# Patient Record
Sex: Female | Born: 1975 | Race: Black or African American | Hispanic: No | Marital: Single | Smoking: Never smoker
Health system: Southern US, Community
[De-identification: ages and names within clinical notes are randomized; demographics above are authoritative.]

## PROBLEM LIST (undated history)

## (undated) DIAGNOSIS — K219 Gastro-esophageal reflux disease without esophagitis: Secondary | ICD-10-CM

---

## 2005-03-13 ENCOUNTER — Emergency Department (HOSPITAL_COMMUNITY): Admission: EM | Admit: 2005-03-13 | Discharge: 2005-03-13 | Payer: Self-pay | Admitting: Emergency Medicine

## 2005-12-30 ENCOUNTER — Ambulatory Visit: Payer: Self-pay | Admitting: Family Medicine

## 2005-12-31 ENCOUNTER — Encounter (INDEPENDENT_AMBULATORY_CARE_PROVIDER_SITE_OTHER): Payer: Self-pay | Admitting: *Deleted

## 2006-01-06 ENCOUNTER — Ambulatory Visit: Payer: Self-pay | Admitting: Family Medicine

## 2006-01-13 ENCOUNTER — Inpatient Hospital Stay (HOSPITAL_COMMUNITY): Admission: AD | Admit: 2006-01-13 | Discharge: 2006-01-13 | Payer: Self-pay | Admitting: Gynecology

## 2006-01-25 ENCOUNTER — Ambulatory Visit (HOSPITAL_COMMUNITY): Admission: RE | Admit: 2006-01-25 | Discharge: 2006-01-25 | Payer: Self-pay | Admitting: Internal Medicine

## 2006-02-09 ENCOUNTER — Ambulatory Visit: Payer: Self-pay | Admitting: Family Medicine

## 2006-03-19 ENCOUNTER — Ambulatory Visit: Payer: Self-pay | Admitting: Family Medicine

## 2006-04-01 ENCOUNTER — Ambulatory Visit: Payer: Self-pay | Admitting: Family Medicine

## 2006-04-08 ENCOUNTER — Ambulatory Visit: Payer: Self-pay | Admitting: Family Medicine

## 2006-04-11 ENCOUNTER — Inpatient Hospital Stay (HOSPITAL_COMMUNITY): Admission: AD | Admit: 2006-04-11 | Discharge: 2006-04-11 | Payer: Self-pay | Admitting: *Deleted

## 2006-04-11 ENCOUNTER — Ambulatory Visit: Payer: Self-pay | Admitting: *Deleted

## 2006-04-21 ENCOUNTER — Ambulatory Visit: Payer: Self-pay

## 2006-05-06 ENCOUNTER — Ambulatory Visit: Payer: Self-pay | Admitting: Family Medicine

## 2006-05-20 ENCOUNTER — Ambulatory Visit: Payer: Self-pay | Admitting: Family Medicine

## 2006-06-11 ENCOUNTER — Ambulatory Visit: Payer: Self-pay | Admitting: Family Medicine

## 2006-06-18 ENCOUNTER — Ambulatory Visit: Payer: Self-pay | Admitting: Family Medicine

## 2006-06-19 ENCOUNTER — Inpatient Hospital Stay (HOSPITAL_COMMUNITY): Admission: AD | Admit: 2006-06-19 | Discharge: 2006-06-19 | Payer: Self-pay | Admitting: Obstetrics & Gynecology

## 2006-06-20 ENCOUNTER — Inpatient Hospital Stay (HOSPITAL_COMMUNITY): Admission: AD | Admit: 2006-06-20 | Discharge: 2006-06-22 | Payer: Self-pay | Admitting: Obstetrics & Gynecology

## 2006-06-20 ENCOUNTER — Ambulatory Visit: Payer: Self-pay | Admitting: Obstetrics & Gynecology

## 2006-06-24 ENCOUNTER — Ambulatory Visit: Admission: RE | Admit: 2006-06-24 | Discharge: 2006-06-24 | Payer: Self-pay | Admitting: Obstetrics & Gynecology

## 2006-08-04 ENCOUNTER — Ambulatory Visit: Payer: Self-pay | Admitting: Sports Medicine

## 2006-12-30 DIAGNOSIS — J45909 Unspecified asthma, uncomplicated: Secondary | ICD-10-CM | POA: Insufficient documentation

## 2006-12-31 ENCOUNTER — Encounter (INDEPENDENT_AMBULATORY_CARE_PROVIDER_SITE_OTHER): Payer: Self-pay | Admitting: *Deleted

## 2007-11-18 ENCOUNTER — Ambulatory Visit: Payer: Self-pay | Admitting: Family Medicine

## 2007-11-18 ENCOUNTER — Encounter: Payer: Self-pay | Admitting: Family Medicine

## 2007-11-18 DIAGNOSIS — N390 Urinary tract infection, site not specified: Secondary | ICD-10-CM | POA: Insufficient documentation

## 2007-11-18 LAB — CONVERTED CEMR LAB
Antibody Screen: NEGATIVE
Basophils Relative: 0 % (ref 0–1)
Eosinophils Absolute: 0.1 10*3/uL (ref 0.0–0.7)
Eosinophils Relative: 1 % (ref 0–5)
Hemoglobin: 11.6 g/dL — ABNORMAL LOW (ref 12.0–15.0)
Lymphs Abs: 1.7 10*3/uL (ref 0.7–4.0)
Monocytes Absolute: 0.3 10*3/uL (ref 0.1–1.0)
Neutro Abs: 5.4 10*3/uL (ref 1.7–7.7)
Neutrophils Relative %: 73 % (ref 43–77)
Platelets: 318 10*3/uL (ref 150–400)
RBC: 3.77 M/uL — ABNORMAL LOW (ref 3.87–5.11)
Rh Type: POSITIVE

## 2007-11-22 ENCOUNTER — Encounter (INDEPENDENT_AMBULATORY_CARE_PROVIDER_SITE_OTHER): Payer: Self-pay | Admitting: Family Medicine

## 2007-11-22 ENCOUNTER — Encounter: Payer: Self-pay | Admitting: Family Medicine

## 2007-11-22 ENCOUNTER — Ambulatory Visit: Payer: Self-pay | Admitting: Family Medicine

## 2007-11-22 LAB — CONVERTED CEMR LAB
Chlamydia, DNA Probe: NEGATIVE
GC Probe Amp, Genital: NEGATIVE

## 2007-11-28 ENCOUNTER — Ambulatory Visit (HOSPITAL_COMMUNITY): Admission: RE | Admit: 2007-11-28 | Discharge: 2007-11-28 | Payer: Self-pay | Admitting: Family Medicine

## 2007-11-28 ENCOUNTER — Telehealth: Payer: Self-pay | Admitting: *Deleted

## 2007-11-29 ENCOUNTER — Encounter: Payer: Self-pay | Admitting: Family Medicine

## 2007-12-22 ENCOUNTER — Ambulatory Visit: Payer: Self-pay | Admitting: Family Medicine

## 2007-12-22 ENCOUNTER — Encounter: Payer: Self-pay | Admitting: Family Medicine

## 2007-12-22 LAB — CONVERTED CEMR LAB
Bilirubin Urine: NEGATIVE
Urobilinogen, UA: 1
pH: 6.5

## 2008-01-02 ENCOUNTER — Encounter: Payer: Self-pay | Admitting: Family Medicine

## 2008-01-17 ENCOUNTER — Ambulatory Visit: Payer: Self-pay | Admitting: Family Medicine

## 2008-01-18 ENCOUNTER — Encounter: Payer: Self-pay | Admitting: Family Medicine

## 2008-01-23 ENCOUNTER — Ambulatory Visit: Payer: Self-pay | Admitting: Sports Medicine

## 2008-01-23 ENCOUNTER — Encounter: Payer: Self-pay | Admitting: Family Medicine

## 2008-02-21 ENCOUNTER — Ambulatory Visit: Payer: Self-pay | Admitting: Family Medicine

## 2008-02-21 ENCOUNTER — Encounter: Payer: Self-pay | Admitting: Family Medicine

## 2008-02-21 LAB — CONVERTED CEMR LAB
HCT: 33.1 % — ABNORMAL LOW (ref 36.0–46.0)
Hemoglobin: 11.2 g/dL — ABNORMAL LOW (ref 12.0–15.0)
MCV: 93.2 fL (ref 78.0–100.0)
Platelets: 244 10*3/uL (ref 150–400)
RDW: 14.3 % (ref 11.5–15.5)

## 2008-02-28 ENCOUNTER — Ambulatory Visit: Payer: Self-pay | Admitting: Family Medicine

## 2008-03-08 ENCOUNTER — Ambulatory Visit: Payer: Self-pay | Admitting: Family Medicine

## 2008-03-08 LAB — CONVERTED CEMR LAB: Protein, U semiquant: NEGATIVE

## 2008-03-22 ENCOUNTER — Ambulatory Visit: Payer: Self-pay | Admitting: Family Medicine

## 2008-03-22 LAB — CONVERTED CEMR LAB

## 2008-03-28 ENCOUNTER — Ambulatory Visit: Payer: Self-pay | Admitting: Family Medicine

## 2008-03-28 ENCOUNTER — Encounter: Payer: Self-pay | Admitting: Family Medicine

## 2008-03-28 LAB — CONVERTED CEMR LAB
GC Probe Amp, Genital: NEGATIVE
Glucose, Urine, Semiquant: NEGATIVE

## 2008-03-29 ENCOUNTER — Encounter: Payer: Self-pay | Admitting: Family Medicine

## 2008-04-05 ENCOUNTER — Ambulatory Visit: Payer: Self-pay | Admitting: Family Medicine

## 2008-04-05 LAB — CONVERTED CEMR LAB: Glucose, Urine, Semiquant: NEGATIVE

## 2008-04-11 ENCOUNTER — Inpatient Hospital Stay (HOSPITAL_COMMUNITY): Admission: AD | Admit: 2008-04-11 | Discharge: 2008-04-13 | Payer: Self-pay | Admitting: Family Medicine

## 2008-04-11 ENCOUNTER — Ambulatory Visit: Payer: Self-pay | Admitting: *Deleted

## 2008-05-28 ENCOUNTER — Encounter: Payer: Self-pay | Admitting: Family Medicine

## 2008-05-28 ENCOUNTER — Ambulatory Visit: Payer: Self-pay | Admitting: Family Medicine

## 2008-05-28 ENCOUNTER — Encounter (INDEPENDENT_AMBULATORY_CARE_PROVIDER_SITE_OTHER): Payer: Self-pay | Admitting: Family Medicine

## 2008-05-28 DIAGNOSIS — D179 Benign lipomatous neoplasm, unspecified: Secondary | ICD-10-CM | POA: Insufficient documentation

## 2008-05-28 LAB — CONVERTED CEMR LAB
Cholesterol: 167 mg/dL (ref 0–200)
HDL: 57 mg/dL (ref >39)
LDL Cholesterol: 101 mg/dL — ABNORMAL HIGH (ref 0–99)
Total CHOL/HDL Ratio: 2.9
VLDL: 9 mg/dL (ref 0–40)

## 2009-01-13 ENCOUNTER — Encounter: Payer: Self-pay | Admitting: Family Medicine

## 2009-12-12 ENCOUNTER — Emergency Department (HOSPITAL_COMMUNITY): Admission: EM | Admit: 2009-12-12 | Discharge: 2009-12-12 | Payer: Self-pay | Admitting: Emergency Medicine

## 2009-12-25 ENCOUNTER — Encounter: Payer: Self-pay | Admitting: Family Medicine

## 2009-12-25 ENCOUNTER — Ambulatory Visit: Payer: Self-pay | Admitting: Internal Medicine

## 2009-12-25 LAB — CONVERTED CEMR LAB
Albumin: 4.2 g/dL (ref 3.5–5.2)
BUN: 12 mg/dL (ref 6–23)
Basophils Absolute: 0 10*3/uL (ref 0.0–0.1)
Calcium: 9.1 mg/dL (ref 8.4–10.5)
Chloride: 102 meq/L (ref 96–112)
Cholesterol: 142 mg/dL (ref 0–200)
Creatinine, Ser: 0.7 mg/dL (ref 0.40–1.20)
Eosinophils Relative: 2 % (ref 0–5)
HDL: 53 mg/dL (ref >39)
LDL Cholesterol: 82 mg/dL (ref 0–99)
Lymphocytes Relative: 45 % (ref 12–46)
Lymphs Abs: 2.5 10*3/uL (ref 0.7–4.0)
Monocytes Absolute: 0.4 10*3/uL (ref 0.1–1.0)
TSH: 1.016 microintl units/mL (ref 0.350–4.500)
Total CHOL/HDL Ratio: 2.7
Total Protein: 7.3 g/dL (ref 6.0–8.3)
Triglycerides: 34 mg/dL (ref <150)
VLDL: 7 mg/dL (ref 0–40)
WBC: 5.5 10*3/uL (ref 4.0–10.5)

## 2010-01-22 ENCOUNTER — Ambulatory Visit: Payer: Self-pay | Admitting: Internal Medicine

## 2010-01-22 ENCOUNTER — Encounter: Payer: Self-pay | Admitting: Family Medicine

## 2010-01-22 ENCOUNTER — Ambulatory Visit (HOSPITAL_COMMUNITY): Admission: RE | Admit: 2010-01-22 | Discharge: 2010-01-22 | Payer: Self-pay | Admitting: Family Medicine

## 2010-01-22 LAB — CONVERTED CEMR LAB
Hgb A2 Quant: 2.9 % (ref 2.2–3.2)
Rhuematoid fact SerPl-aCnc: <20 intl units/mL (ref 0–20)

## 2010-02-24 ENCOUNTER — Ambulatory Visit: Payer: Self-pay | Admitting: Internal Medicine

## 2010-02-24 ENCOUNTER — Encounter: Payer: Self-pay | Admitting: Family Medicine

## 2010-02-28 ENCOUNTER — Ambulatory Visit: Payer: Self-pay | Admitting: Family Medicine

## 2010-04-14 ENCOUNTER — Encounter (INDEPENDENT_AMBULATORY_CARE_PROVIDER_SITE_OTHER): Payer: Self-pay | Admitting: Family Medicine

## 2010-04-14 ENCOUNTER — Ambulatory Visit: Payer: Self-pay | Admitting: Internal Medicine

## 2010-04-14 LAB — CONVERTED CEMR LAB: Microalb, Ur: 0.5 mg/dL (ref 0.00–1.89)

## 2010-12-02 NOTE — Assessment & Plan Note (Signed)
Summary: discuss blood work,tcb   Vital Signs:  Patient profile:   35 year old female Weight:      167.7 pounds Temp:     98 degrees F oral Pulse rate:   92 / minute Pulse rhythm:   regular BP sitting:   111 / 78  (left arm) Cuff size:   regular  Vitals Entered By: Loralee Pacas CMA (February 28, 2010 11:57 AM)   History of Present Illness: no charge visit.  pt just wanted to know what her past sickle cell screens showed.  last one was negative.     Complete Medication List: 1)  Ventolin Hfa 108 (90 Base) Mcg/act Aers (Albuterol sulfate) .... Take 1-2 puffs every 4 hours as needed for wheezing 2)  P-d Natal Plus 0.8 Mg Tabs (Prenatal multivit-min-fe-fa) .... Take 1 tablet by mouth daily  Other Orders: No Charge Patient Arrived (NCPA0) (NCPA0)

## 2011-07-30 LAB — CBC
Platelets: 256
RDW: 14.1
WBC: 10

## 2011-09-17 ENCOUNTER — Encounter: Payer: Self-pay | Admitting: Emergency Medicine

## 2011-09-17 ENCOUNTER — Emergency Department (INDEPENDENT_AMBULATORY_CARE_PROVIDER_SITE_OTHER)
Admission: EM | Admit: 2011-09-17 | Discharge: 2011-09-17 | Disposition: A | Discharge status: Home / Self Care | Payer: Self-pay | Source: Home / Self Care

## 2011-09-17 DIAGNOSIS — J309 Allergic rhinitis, unspecified: Secondary | ICD-10-CM

## 2011-09-17 DIAGNOSIS — J45909 Unspecified asthma, uncomplicated: Secondary | ICD-10-CM

## 2011-09-17 MED ORDER — FEXOFENADINE-PSEUDOEPHED ER 60-120 MG PO TB12: 1.0000 | ORAL_TABLET | Freq: Two times a day (BID) | ORAL | Status: AC

## 2011-09-17 MED ORDER — PREDNISONE 20 MG PO TABS: ORAL_TABLET | ORAL | Status: AC

## 2011-09-17 NOTE — ED Notes (Signed)
C/o severe cough.  Onset 3 weeks ago.

## 2011-09-18 NOTE — ED Provider Notes (Signed)
History     CSN: [id number] Arrival date & time: 09/17/2011  7:56 PM   First MD Initiated Contact with Patient 09/17/11 1944      Chief Complaint  Patient presents with  . Cough    (Consider location/radiation/quality/duration/timing/severity/associated sxs/prior treatment) Patient is a 35 y.o. female presenting with cough. The history is provided by the patient.  Cough This is a new problem. The current episode started more than 1 week ago. The problem occurs constantly. The problem has not changed since onset.The cough is non-productive. There has been no fever. Associated symptoms include rhinorrhea and wheezing. Pertinent negatives include no chest pain, no chills, no sweats, no weight loss, no ear pain, no headaches, no sore throat, no myalgias and no shortness of breath. She has tried cough syrup for the symptoms. The treatment provided mild relief. She is not a smoker. Her past medical history is significant for asthma. Her past medical history does not include pneumonia or COPD.    Past Medical History  Diagnosis Date  . Asthma     History reviewed. No pertinent past surgical history.  History reviewed. No pertinent family history.  History  Substance Use Topics  . Smoking status: Never Smoker   . Smokeless tobacco: Not on file  . Alcohol Use: No    OB History    Grav Para Term Preterm Abortions TAB SAB Ect Mult Living                  Review of Systems  Constitutional: Negative.  Negative for chills and weight loss.  HENT: Positive for congestion, rhinorrhea, sneezing, voice change and postnasal drip. Negative for ear pain, sore throat, trouble swallowing and neck pain.   Eyes: Negative for discharge.  Respiratory: Positive for cough and wheezing. Negative for chest tightness, shortness of breath and stridor.   Cardiovascular: Negative for chest pain.  Gastrointestinal: Negative for nausea, vomiting and diarrhea.  Musculoskeletal: Negative for myalgias and  arthralgias.  Skin: Negative for rash.  Neurological: Negative for headaches.    Allergies  Review of patient's allergies indicates no known allergies.  Home Medications   Current Outpatient Rx  Name Route Sig Dispense Refill  . VENTOLIN IN Inhalation Inhale into the lungs.      . TRIAMCINOLONE ACETONIDE 55 MCG/ACT NA INHA Nasal Place 2 sprays into the nose daily.      Marland Kitchen FEXOFENADINE-PSEUDOEPHEDRINE 60-120 MG PO TB12 Oral Take 1 tablet by mouth every 12 (twelve) hours. 30 tablet 0  . PREDNISONE 20 MG PO TABS  2 tabs po daily for 5 days 10 tablet no    BP 132/87  Pulse 76  Temp(Src) 98.6 F (37 C) (Oral)  Resp 18  SpO2 98%  LMP 09/16/2011  Physical Exam  Nursing note and vitals reviewed. Constitutional: She is oriented to person, place, and time. She appears well-developed and well-nourished. No distress.  HENT:  Right Ear: External ear normal.  Left Ear: External ear normal.  Mouth/Throat: No oropharyngeal exudate.       Significant erythema and swelling of nasal turbinates. Clear rhinorrhea and postnasal drip. Mild pharyngeal erythema  Hoarseness   Eyes: Pupils are equal, round, and reactive to light. Right eye exhibits no discharge. Left eye exhibits no discharge.  Neck: Normal range of motion. No JVD present. No thyromegaly present.  Cardiovascular: Normal rate, regular rhythm and normal heart sounds.   Pulmonary/Chest: No stridor. No respiratory distress. She has no wheezes. She has no rales.  Lymphadenopathy:  She has no cervical adenopathy.  Neurological: She is alert and oriented to person, place, and time.  Skin: No rash noted.    ED Course  Procedures (including critical care time)  Labs Reviewed - No data to display No results found.   1. Allergic rhinitis   2. Asthma       MDM  Treated for allergic rhinitis likely causing asthma exacerbation with prednisone, allegra D, continue inhaler and restart nasal steroid. Return if worsening or no  resolving symptoms.        Sharin Grave, MD 09/18/11 1312

## 2012-05-12 ENCOUNTER — Encounter (HOSPITAL_COMMUNITY): Payer: Self-pay | Admitting: Emergency Medicine

## 2012-05-12 ENCOUNTER — Emergency Department (INDEPENDENT_AMBULATORY_CARE_PROVIDER_SITE_OTHER)
Admission: EM | Admit: 2012-05-12 | Discharge: 2012-05-12 | Disposition: A | Discharge status: Home / Self Care | Payer: Self-pay | Source: Home / Self Care | Attending: Emergency Medicine | Admitting: Emergency Medicine

## 2012-05-12 ENCOUNTER — Emergency Department (INDEPENDENT_AMBULATORY_CARE_PROVIDER_SITE_OTHER): Payer: Self-pay

## 2012-05-12 DIAGNOSIS — S300XXA Contusion of lower back and pelvis, initial encounter: Secondary | ICD-10-CM

## 2012-05-12 MED ORDER — TRAMADOL HCL 50 MG PO TABS: 100.0000 mg | ORAL_TABLET | Freq: Three times a day (TID) | ORAL | Status: AC | PRN

## 2012-05-12 NOTE — ED Notes (Signed)
Pt here with localized tail bone pain after tripping down staircase at home Tuesday night.landed on back.pt has been taking tylenol for discomfort.pain with sitting or movement.

## 2012-05-12 NOTE — ED Provider Notes (Signed)
Chief Complaint  Patient presents with  . Tailbone Pain  . Fall    History of Present Illness:    The patient is a 36 year old female who slipped on some steps 3 days ago and fell, landing on her buttocks. Ever since then she's had pain over her coccyx area. It hurts her to assist, particularly on hard surface. She denies any swelling or bruising. There has been no blood in her stool or her urine. She denies any pain with defecation or with intercourse. She denies any radiation the pain down into her legs, numbness, tingling, or weakness.  Review of Systems:  Other than noted above, the patient denies any of the following symptoms: Systemic:  No fevers or chills. GI:  No abdominal pain. No nausea, vomiting, or diarrhea. GU:  No blood in urine. M-S:  No extremity pain, swelling, bruising, limited ROM, neck or back pain. Neuro:  No headache, loss of consciousness, seizure activity, dizziness, vertigo, paresthesias, numbness, or weakness.  No difficulty with speech or ambulation.   PMFSH:  Past medical history, family history, social history, meds, and allergies were reviewed.  Physical Exam:   Vital signs:  BP 124/87  Pulse 88  Temp 98.3 F (36.8 C) (Oral)  Resp 16  SpO2 100%  LMP 04/18/2012 General:  Alert, oriented and in no distress. Abdomen:  Non tender. Back:  She has tenderness to palpation right over the coccyx, no swelling, bruising, or deformity.  Full ROM without pain. Extremities:  No tenderness, swelling, bruising or deformity.  Full ROM of all joints without pain.  Pulses full.  Brisk capillary refill. Neuro:  Alert and oriented times 3.  Cranial nerves intact.  No muscle weakness.  Sensation intact to light touch.  Gait normal. Skin:  No bruising, abrasions, or lacerations.  Radiology:  Dg Sacrum/coccyx  05/12/2012  *RADIOLOGY REPORT*  Clinical Data: 36 year old female status post fall with pain.  SACRUM AND COCCYX - 2+ VIEW  Comparison: None.  Findings: Bone  mineralization is within normal limits.  Sacral ala appear intact.  SI joints are within normal limits.  On the lateral view, sacral and coccygeal segments appear within normal limits. The remaining visualized pelvis appears intact.  IMPRESSION: No acute fracture or dislocation identified about the sacrum or coccyx.  Original Report Authenticated By: Harley Hallmark, M.D.   Assessment:  The encounter diagnosis was Coccyx contusion.  Plan:   1.  The following meds were prescribed:   New Prescriptions   TRAMADOL (ULTRAM) 50 MG TABLET    Take 2 tablets (100 mg total) by mouth every 8 (eight) hours as needed for pain.   2.  The patient was instructed in symptomatic care and handouts were given. 3.  The patient was told to return if becoming worse in any way, if no better in 3 or 4 days, and given some red flag symptoms that would indicate earlier return. The patient was instructed to apply ice for the first 72 hours and thereafter to switch over to heat. She was instructed to use a foam rubber donut, memory foam pillow, or inflatable doughnut to sit. She was advised that this would take up to a month the heel up completely.    Reuben Likes, MD 05/12/12 [phone]

## 2013-04-14 ENCOUNTER — Emergency Department (HOSPITAL_BASED_OUTPATIENT_CLINIC_OR_DEPARTMENT_OTHER)
Admission: EM | Admit: 2013-04-14 | Discharge: 2013-04-14 | Disposition: A | Discharge status: Home / Self Care | Payer: BC Managed Care – PPO | Attending: Emergency Medicine | Admitting: Emergency Medicine

## 2013-04-14 ENCOUNTER — Encounter (HOSPITAL_BASED_OUTPATIENT_CLINIC_OR_DEPARTMENT_OTHER): Payer: Self-pay

## 2013-04-14 DIAGNOSIS — Z3202 Encounter for pregnancy test, result negative: Secondary | ICD-10-CM | POA: Insufficient documentation

## 2013-04-14 DIAGNOSIS — R5381 Other malaise: Secondary | ICD-10-CM | POA: Insufficient documentation

## 2013-04-14 DIAGNOSIS — J45909 Unspecified asthma, uncomplicated: Secondary | ICD-10-CM | POA: Insufficient documentation

## 2013-04-14 DIAGNOSIS — K219 Gastro-esophageal reflux disease without esophagitis: Secondary | ICD-10-CM | POA: Insufficient documentation

## 2013-04-14 DIAGNOSIS — R531 Weakness: Secondary | ICD-10-CM

## 2013-04-14 DIAGNOSIS — R5383 Other fatigue: Secondary | ICD-10-CM | POA: Insufficient documentation

## 2013-04-14 DIAGNOSIS — Z79899 Other long term (current) drug therapy: Secondary | ICD-10-CM | POA: Insufficient documentation

## 2013-04-14 HISTORY — DX: Gastro-esophageal reflux disease without esophagitis: K21.9

## 2013-04-14 LAB — CBC WITH DIFFERENTIAL/PLATELET
Eosinophils Absolute: 0.1 10*3/uL (ref 0.0–0.7)
Hemoglobin: 12.3 g/dL (ref 12.0–15.0)
Lymphocytes Relative: 44 % (ref 12–46)
Lymphs Abs: 3 10*3/uL (ref 0.7–4.0)
MCH: 30.8 pg (ref 26.0–34.0)
MCV: 88 fL (ref 78.0–100.0)
Monocytes Relative: 6 % (ref 3–12)
Neutrophils Relative %: 47 % (ref 43–77)
RBC: 3.99 MIL/uL (ref 3.87–5.11)
WBC: 6.7 10*3/uL (ref 4.0–10.5)

## 2013-04-14 LAB — URINALYSIS, ROUTINE W REFLEX MICROSCOPIC
Bilirubin Urine: NEGATIVE
Glucose, UA: NEGATIVE mg/dL
Specific Gravity, Urine: 1.021 (ref 1.005–1.030)
pH: 7.5 (ref 5.0–8.0)

## 2013-04-14 LAB — URINE MICROSCOPIC-ADD ON

## 2013-04-14 LAB — BASIC METABOLIC PANEL
BUN: 15 mg/dL (ref 6–23)
CO2: 25 mEq/L (ref 19–32)
Chloride: 107 mEq/L (ref 96–112)
GFR calc non Af Amer: >90 mL/min (ref >90)
Glucose, Bld: 101 mg/dL — ABNORMAL HIGH (ref 70–99)
Potassium: 4.2 mEq/L (ref 3.5–5.1)
Sodium: 140 mEq/L (ref 135–145)

## 2013-04-14 LAB — PREGNANCY, URINE: Preg Test, Ur: NEGATIVE

## 2013-04-14 MED ORDER — ACETAMINOPHEN 325 MG PO TABS
ORAL_TABLET | ORAL | Status: AC
  Filled 2013-04-14: qty 2

## 2013-04-14 MED ORDER — SODIUM CHLORIDE 0.9 % IV BOLUS (SEPSIS): 1000.0000 mL | Freq: Once | INTRAVENOUS | Status: DC

## 2013-04-14 MED ORDER — ACETAMINOPHEN 325 MG PO TABS
650.0000 mg | ORAL_TABLET | Freq: Once | ORAL | Status: AC
  Administered 2013-04-14: 650 mg via ORAL

## 2013-04-14 MED ORDER — SODIUM CHLORIDE 0.9 % IV SOLN
Freq: Once | INTRAVENOUS | Status: AC
  Administered 2013-04-14: 22:00:00 via INTRAVENOUS

## 2013-04-14 NOTE — ED Provider Notes (Signed)
History     CSN: [id number]  Arrival date & time 04/14/13  2036   First MD Initiated Contact with Patient 04/14/13 2137      Chief Complaint  Patient presents with  . Numbness    (Consider location/radiation/quality/duration/timing/severity/associated sxs/prior treatment) HPI This is a 37 year old female comes in today with no significant past medical history complaining of diffuse generalized weakness. She states this came on this evening after work. She states that her tongue feels anomaly. She denies any headache but has had some nasal congestion. He denies a sore throat or difficulty swallowing. She has not had any chest pain, cough, nausea, vomiting, or diarrhea. She has no lateralized weakness. She has not been out in the hot weather and has no history of volume depletion. Past Medical History  Diagnosis Date  . Asthma   . GERD (gastroesophageal reflux disease)     History reviewed. No pertinent past surgical history.  No family history on file.  History  Substance Use Topics  . Smoking status: Never Smoker   . Smokeless tobacco: Not on file  . Alcohol Use: No    OB History   Grav Para Term Preterm Abortions TAB SAB Ect Mult Living                  Review of Systems  All other systems reviewed and are negative.    Allergies  Review of patient's allergies indicates no known allergies.  Home Medications   Current Outpatient Rx  Name  Route  Sig  Dispense  Refill  . famotidine (PEPCID) 20 MG tablet   Oral   Take 20 mg by mouth 2 (two) times daily.         . Albuterol (VENTOLIN IN)   Inhalation   Inhale into the lungs.           . triamcinolone (NASACORT AQ) 55 MCG/ACT nasal inhaler   Nasal   Place 2 sprays into the nose daily.             BP 148/101  Pulse 78  Temp(Src) 98.5 F (36.9 C) (Oral)  Resp 16  Ht 5\' 8"  (1.727 m)  Wt 178 lb (80.74 kg)  BMI 27.07 kg/m2  SpO2 98%  LMP 04/06/2013  Physical Exam  Nursing note and vitals  reviewed. Constitutional: She is oriented to person, place, and time. She appears well-developed and well-nourished.  HENT:  Head: Normocephalic and atraumatic.  Right Ear: Tympanic membrane and external ear normal.  Left Ear: Tympanic membrane and external ear normal.  Nose: Nose normal. Right sinus exhibits no maxillary sinus tenderness and no frontal sinus tenderness. Left sinus exhibits no maxillary sinus tenderness and no frontal sinus tenderness.  Eyes: Conjunctivae and EOM are normal. Pupils are equal, round, and reactive to light. Right eye exhibits no nystagmus. Left eye exhibits no nystagmus.  Neck: Normal range of motion. Neck supple.  Cardiovascular: Normal rate, regular rhythm, normal heart sounds and intact distal pulses.   Pulmonary/Chest: Effort normal and breath sounds normal. No respiratory distress. She exhibits no tenderness.  Abdominal: Soft. Bowel sounds are normal. She exhibits no distension and no mass. There is no tenderness.  Musculoskeletal: Normal range of motion. She exhibits no edema and no tenderness.  Neurological: She is alert and oriented to person, place, and time. She has normal strength and normal reflexes. No sensory deficit. She displays a negative Romberg sign. GCS eye subscore is 4. GCS verbal subscore is 5. GCS motor subscore is  6.  Reflex Scores:      Tricep reflexes are 2+ on the right side and 2+ on the left side.      Bicep reflexes are 2+ on the right side and 2+ on the left side.      Brachioradialis reflexes are 2+ on the right side and 2+ on the left side.      Patellar reflexes are 2+ on the right side and 2+ on the left side.      Achilles reflexes are 2+ on the right side and 2+ on the left side. Patient with normal gait without ataxia, shuffling, spasm, or antalgia. Speech is normal without dysarthria, dysphasia, or aphasia. Muscle strength is 5/5 in bilateral shoulders, elbow flexor and extensors, wrist flexor and extensors, and intrinsic  hand muscles. 5/5 bilateral lower extremity hip flexors, extensors, knee flexors and extensors, and ankle dorsi and plantar flexors.    Skin: Skin is warm and dry. No rash noted.  Psychiatric: She has a normal mood and affect. Her behavior is normal. Judgment and thought content normal.    ED Course  Procedures (including critical care time)  Labs Reviewed  CBC WITH DIFFERENTIAL - Abnormal; Notable for the following:    HCT 35.1 (*)    All other components within normal limits  BASIC METABOLIC PANEL - Abnormal; Notable for the following:    Glucose, Bld 101 (*)    All other components within normal limits  URINALYSIS, ROUTINE W REFLEX MICROSCOPIC - Abnormal; Notable for the following:    APPearance CLOUDY (*)    Hgb urine dipstick LARGE (*)    All other components within normal limits  URINE MICROSCOPIC-ADD ON - Abnormal; Notable for the following:    Bacteria, UA FEW (*)    All other components within normal limits  PREGNANCY, URINE   No results found.   No diagnosis found.    MDM  She is  given IV normal saline. Her lab work is normal. Her hemoglobin shows blood but she is currently menstruating. Patient has normal vital signs, normal assessment, and normal labs here she is discharged home to followup with her primary care         Hilario Quarry, MD 04/14/13 2226

## 2013-04-14 NOTE — ED Notes (Signed)
Pt seated in w/c in ED WR-brought into triage-c/o approx 53opm she ws leaving school-started feeling tired-after home started having numbness to tongue and tingling to jaw-A/O-denies pain-PERRLA-facial symmetry, equal hand grips

## 2015-02-01 ENCOUNTER — Emergency Department (HOSPITAL_BASED_OUTPATIENT_CLINIC_OR_DEPARTMENT_OTHER): Payer: BLUE CROSS/BLUE SHIELD

## 2015-02-01 ENCOUNTER — Observation Stay (HOSPITAL_BASED_OUTPATIENT_CLINIC_OR_DEPARTMENT_OTHER)
Admission: EM | Admit: 2015-02-01 | Discharge: 2015-02-03 | Disposition: A | Discharge status: Home / Self Care | Payer: BLUE CROSS/BLUE SHIELD | Attending: Surgery | Admitting: Surgery

## 2015-02-01 ENCOUNTER — Encounter (HOSPITAL_BASED_OUTPATIENT_CLINIC_OR_DEPARTMENT_OTHER): Payer: Self-pay

## 2015-02-01 DIAGNOSIS — K3580 Unspecified acute appendicitis: Secondary | ICD-10-CM | POA: Diagnosis not present

## 2015-02-01 DIAGNOSIS — K37 Unspecified appendicitis: Secondary | ICD-10-CM | POA: Diagnosis present

## 2015-02-01 DIAGNOSIS — Z9049 Acquired absence of other specified parts of digestive tract: Secondary | ICD-10-CM

## 2015-02-01 DIAGNOSIS — R1031 Right lower quadrant pain: Secondary | ICD-10-CM | POA: Diagnosis present

## 2015-02-01 DIAGNOSIS — K219 Gastro-esophageal reflux disease without esophagitis: Secondary | ICD-10-CM | POA: Diagnosis not present

## 2015-02-01 DIAGNOSIS — J45909 Unspecified asthma, uncomplicated: Secondary | ICD-10-CM | POA: Diagnosis not present

## 2015-02-01 LAB — URINALYSIS, ROUTINE W REFLEX MICROSCOPIC
Bilirubin Urine: NEGATIVE
GLUCOSE, UA: NEGATIVE mg/dL
KETONES UR: NEGATIVE mg/dL
LEUKOCYTES UA: NEGATIVE
NITRITE: NEGATIVE
PROTEIN: NEGATIVE mg/dL
Specific Gravity, Urine: 1.007 (ref 1.005–1.030)
UROBILINOGEN UA: 0.2 mg/dL (ref 0.0–1.0)
pH: 6.5 (ref 5.0–8.0)

## 2015-02-01 LAB — URINE MICROSCOPIC-ADD ON

## 2015-02-01 LAB — COMPREHENSIVE METABOLIC PANEL
ALT: 18 U/L (ref 0–35)
AST: 26 U/L (ref 0–37)
Albumin: 4 g/dL (ref 3.5–5.2)
Alkaline Phosphatase: 90 U/L (ref 39–117)
Anion gap: 5 (ref 5–15)
BILIRUBIN TOTAL: 0.1 mg/dL — AB (ref 0.3–1.2)
BUN: 13 mg/dL (ref 6–23)
CALCIUM: 8.6 mg/dL (ref 8.4–10.5)
CO2: 23 mmol/L (ref 19–32)
CREATININE: 0.76 mg/dL (ref 0.50–1.10)
Chloride: 108 mmol/L (ref 96–112)
GLUCOSE: 111 mg/dL — AB (ref 70–99)
Potassium: 3.6 mmol/L (ref 3.5–5.1)
Sodium: 136 mmol/L (ref 135–145)
Total Protein: 7.6 g/dL (ref 6.0–8.3)

## 2015-02-01 LAB — CBC
HCT: 39.8 % (ref 36.0–46.0)
Hemoglobin: 13.7 g/dL (ref 12.0–15.0)
MCH: 30.7 pg (ref 26.0–34.0)
MCHC: 34.4 g/dL (ref 30.0–36.0)
MCV: 89.2 fL (ref 78.0–100.0)
PLATELETS: 305 10*3/uL (ref 150–400)
RBC: 4.46 MIL/uL (ref 3.87–5.11)
RDW: 12.2 % (ref 11.5–15.5)
WBC: 11.1 10*3/uL — AB (ref 4.0–10.5)

## 2015-02-01 LAB — PREGNANCY, URINE: Preg Test, Ur: NEGATIVE

## 2015-02-01 LAB — LIPASE, BLOOD: Lipase: 34 U/L (ref 11–59)

## 2015-02-01 MED ORDER — SODIUM CHLORIDE 0.9 % IV BOLUS (SEPSIS)
1000.0000 mL | Freq: Once | INTRAVENOUS | Status: AC
  Administered 2015-02-01: 1000 mL via INTRAVENOUS

## 2015-02-01 MED ORDER — IOHEXOL 300 MG/ML  SOLN
25.0000 mL | Freq: Once | INTRAMUSCULAR | Status: AC | PRN
  Administered 2015-02-01: 25 mL via ORAL

## 2015-02-01 MED ORDER — ONDANSETRON HCL 4 MG/2ML IJ SOLN
4.0000 mg | Freq: Once | INTRAMUSCULAR | Status: AC
  Administered 2015-02-01: 4 mg via INTRAVENOUS
  Filled 2015-02-01: qty 2

## 2015-02-01 MED ORDER — PIPERACILLIN-TAZOBACTAM 4.5 G IVPB
4.5000 g | Freq: Once | INTRAVENOUS | Status: DC
  Filled 2015-02-01: qty 100

## 2015-02-01 MED ORDER — IOHEXOL 300 MG/ML  SOLN
100.0000 mL | Freq: Once | INTRAMUSCULAR | Status: AC | PRN
  Administered 2015-02-01: 100 mL via INTRAVENOUS

## 2015-02-01 MED ORDER — MORPHINE SULFATE 4 MG/ML IJ SOLN
4.0000 mg | Freq: Once | INTRAMUSCULAR | Status: AC
  Administered 2015-02-01: 4 mg via INTRAVENOUS
  Filled 2015-02-01: qty 1

## 2015-02-01 MED ORDER — PIPERACILLIN-TAZOBACTAM 3.375 G IVPB
INTRAVENOUS | Status: AC
  Administered 2015-02-01: 3.375 g
  Filled 2015-02-01: qty 50

## 2015-02-01 NOTE — ED Provider Notes (Signed)
11:44 PM Received from [facility] with acute appendicitis. NPO. Maintenance fluids. pts pain is controlled at this time. Will discuss with [facility] [place] surgery  Ct Abdomen Pelvis W Contrast  02/01/2015   CLINICAL DATA:  Right-sided abdominal pain for 3 days, worsening today.  EXAM: CT ABDOMEN AND PELVIS WITH CONTRAST  TECHNIQUE: Multidetector CT imaging of the abdomen and pelvis was performed using the standard protocol following bolus administration of intravenous contrast.  CONTRAST:  67mL OMNIPAQUE IOHEXOL 300 MG/ML SOLN, 168mL OMNIPAQUE IOHEXOL 300 MG/ML SOLN  COMPARISON:  None.  FINDINGS: There is inflammation of the appendiceal tip. The base of the appendix is normal in appearance. The appendiceal tip is mildly enlarged, measuring a little over 8 mm. There is no abscess. There is no extraluminal air. Remainder of the bowel is normal in appearance. No other acute inflammatory changes are evident in the abdomen or pelvis.  There are unremarkable appearances of the liver, spleen, pancreas, adrenals and kidneys. The uterus and ovaries appear unremarkable. No significant abnormalities are evident in the lower chest.  No acute musculoskeletal abnormalities are evident. There is a small fat containing umbilical hernia.  IMPRESSION: Acute appendicitis. These results were called by telephone at the time of interpretation on 02/01/2015 at 8:49 pm to Dr. Alfonzo Beers , who verbally acknowledged these results.   Electronically Signed   By: Andreas Newport M.D.   On: 02/01/2015 20:49   I personally reviewed the imaging tests through PACS system I reviewed available ER/hospitalization records through the [place], MD 02/01/15 2346

## 2015-02-01 NOTE — ED Notes (Signed)
Pt arrived via ambulance, c/o RLQ pain "10" 0-10 .  Ambulates to bathroom urine obtained.

## 2015-02-01 NOTE — ED Notes (Signed)
Bed: OQ94 Expected date:  Expected time:  Means of arrival:  Comments: [place]

## 2015-02-01 NOTE — ED Provider Notes (Signed)
CSN: [id number]     Arrival date & time 02/01/15  1827 History  This chart was scribed for Paula Beers, MD by Peyton Bottoms, ED Scribe. This patient was seen in room MH10/MH10 and the patient's care was started at 7:05 PM.   Chief Complaint  Patient presents with  . Abdominal Pain   Patient is a 39 y.o. female presenting with abdominal pain. The history is provided by the patient. No language interpreter was used.  Abdominal Pain Pain location:  RLQ Pain quality: aching, cramping, sharp and squeezing   Pain radiates to:  Does not radiate Pain severity:  Moderate Onset quality:  Gradual Duration:  2 days Timing:  Intermittent Progression:  Waxing and waning Chronicity:  New Relieved by:  Nothing Worsened by:  Nothing tried Associated symptoms: no fever    HPI Comments: Paula Beard is a 39 y.o. female brought in by EMS, with a PMHx of asthma, and GERD, who presents to the Emergency Department complaining of moderate RLQ abdominal pain that began 2 days ago with pain worsening earlier today. She denies associated dysuria, or fever. She denies hx of abdominal surgery. She states that pain is exacerbated by movement. She denies radiation of pain to back.  Past Medical History  Diagnosis Date  . Asthma   . GERD (gastroesophageal reflux disease)    History reviewed. No pertinent past surgical history. History reviewed. No pertinent family history. History  Substance Use Topics  . Smoking status: Never Smoker   . Smokeless tobacco: Not on file  . Alcohol Use: No   OB History    No data available     Review of Systems  Constitutional: Negative for fever.  Gastrointestinal: Positive for abdominal pain.  Musculoskeletal: Negative for back pain.  All other systems reviewed and are negative.  Allergies  Review of patient's allergies indicates no known allergies.  Home Medications   Prior to Admission medications   Medication Sig Start Date End Date Taking? Authorizing  Provider  Dexlansoprazole 30 MG capsule Take 30 mg by mouth daily.   Yes Historical Provider, MD  Albuterol (VENTOLIN IN) Inhale into the lungs.      Historical Provider, MD  famotidine (PEPCID) 20 MG tablet Take 20 mg by mouth 2 (two) times daily.    Historical Provider, MD  triamcinolone (NASACORT AQ) 55 MCG/ACT nasal inhaler Place 2 sprays into the nose daily.      Historical Provider, MD   Triage Vitals: BP 159/91 mmHg  Pulse 88  Temp(Src) 98.5 F (36.9 C)  Resp 16  Ht 5\' 8"  (1.727 m)  Wt 188 lb (85.276 kg)  BMI 28.59 kg/m2  SpO2 100% Vitals reviewed Physical Exam  Physical Examination: General appearance - alert, well appearing, and in no distress Mental status - alert, oriented to person, place, and time Eyes - no conjunctival injection, no scleral icterus Mouth - mucous membranes moist, pharynx normal without lesions Chest - clear to auscultation, no wheezes, rales or rhonchi, symmetric air entry Heart - normal rate, regular rhythm, normal S1, S2, no murmurs, rubs, clicks or gallops Abdomen - soft, ttp in right lower abdomen, some voluntary gaurding, no rebound tenderness, nondistended, no masses or organomegaly, nabs Extremities - peripheral pulses normal, no pedal edema, no clubbing or cyanosis Skin - normal coloration and turgor, no rashes  ED Course  Procedures (including critical care time)  DIAGNOSTIC STUDIES: Oxygen Saturation is 100% on RA, normal by my interpretation.    COORDINATION OF CARE: 7:07 PM- Discussed  plans to order diagnostic CT of abdomen pelvis, urinalysis, and lab work. Will give patient morphine, Zofran and IV fluids. Pt advised of plan for treatment and pt agrees.  9:34 PM d/w Dr. Hassell Done, surgery, he will see patient at [facility] ED. Recommended starting patient on zosyn.    9:35 PM d/w Dr. Ralene Bathe, [place] to let her know the patient will be coming.    9:50 PM pt updated and questions answered.    Labs Review Labs Reviewed  URINALYSIS,  ROUTINE W REFLEX MICROSCOPIC - Abnormal; Notable for the following:    Hgb urine dipstick SMALL (*)    All other components within normal limits  CBC - Abnormal; Notable for the following:    WBC 11.1 (*)    All other components within normal limits  COMPREHENSIVE METABOLIC PANEL - Abnormal; Notable for the following:    Glucose, Bld 111 (*)    Total Bilirubin 0.1 (*)    All other components within normal limits  PREGNANCY, URINE  LIPASE, BLOOD  URINE MICROSCOPIC-ADD ON   Imaging Review Ct Abdomen Pelvis W Contrast  02/01/2015   CLINICAL DATA:  Right-sided abdominal pain for 3 days, worsening today.  EXAM: CT ABDOMEN AND PELVIS WITH CONTRAST  TECHNIQUE: Multidetector CT imaging of the abdomen and pelvis was performed using the standard protocol following bolus administration of intravenous contrast.  CONTRAST:  27mL OMNIPAQUE IOHEXOL 300 MG/ML SOLN, 133mL OMNIPAQUE IOHEXOL 300 MG/ML SOLN  COMPARISON:  None.  FINDINGS: There is inflammation of the appendiceal tip. The base of the appendix is normal in appearance. The appendiceal tip is mildly enlarged, measuring a little over 8 mm. There is no abscess. There is no extraluminal air. Remainder of the bowel is normal in appearance. No other acute inflammatory changes are evident in the abdomen or pelvis.  There are unremarkable appearances of the liver, spleen, pancreas, adrenals and kidneys. The uterus and ovaries appear unremarkable. No significant abnormalities are evident in the lower chest.  No acute musculoskeletal abnormalities are evident. There is a small fat containing umbilical hernia.  IMPRESSION: Acute appendicitis. These results were called by telephone at the time of interpretation on 02/01/2015 at 8:49 pm to Dr. Alfonzo Beard , who verbally acknowledged these results.   Electronically Signed   By: Andreas Newport M.D.   On: 02/01/2015 20:49     EKG Interpretation None     MDM   Final diagnoses:  Other acute appendicitis   Leukocytosis    Pt presenting with right lower abdominal pain.  Pt with mild leukocytosis.  Pt treated with IV fluids, pain meds and nausea meds.  She is NPO.  CT scan shows acute appendicitis.  D/w Dr. Hassell Done, surgery who requests to see her at [facility] ED.  Also d/w Dr. Ralene Bathe.    I personally performed the services described in this documentation, which was scribed in my presence. The recorded information has been reviewed and is accurate.   Paula Beers, MD 02/02/15 [phone]

## 2015-02-01 NOTE — ED Notes (Signed)
MD at bedside discussing pts diagnosis and admission plan.

## 2015-02-02 ENCOUNTER — Encounter (HOSPITAL_COMMUNITY)
Admission: EM | Disposition: A | Discharge status: Home / Self Care | Payer: Self-pay | Source: Home / Self Care | Attending: Emergency Medicine

## 2015-02-02 ENCOUNTER — Observation Stay (HOSPITAL_COMMUNITY): Payer: BLUE CROSS/BLUE SHIELD | Admitting: Certified Registered"

## 2015-02-02 ENCOUNTER — Encounter (HOSPITAL_COMMUNITY): Payer: Self-pay | Admitting: General Practice

## 2015-02-02 DIAGNOSIS — K37 Unspecified appendicitis: Secondary | ICD-10-CM | POA: Diagnosis present

## 2015-02-02 DIAGNOSIS — Z9049 Acquired absence of other specified parts of digestive tract: Secondary | ICD-10-CM

## 2015-02-02 HISTORY — PX: LAPAROSCOPIC APPENDECTOMY: SHX408

## 2015-02-02 LAB — CBC
HEMATOCRIT: 37.9 % (ref 36.0–46.0)
HEMOGLOBIN: 12.7 g/dL (ref 12.0–15.0)
MCH: 30.2 pg (ref 26.0–34.0)
MCHC: 33.5 g/dL (ref 30.0–36.0)
MCV: 90.2 fL (ref 78.0–100.0)
Platelets: 306 10*3/uL (ref 150–400)
RBC: 4.2 MIL/uL (ref 3.87–5.11)
RDW: 12.7 % (ref 11.5–15.5)
WBC: 9.3 10*3/uL (ref 4.0–10.5)

## 2015-02-02 LAB — CREATININE, SERUM
Creatinine, Ser: 0.76 mg/dL (ref 0.50–1.10)
GFR calc non Af Amer: >90 mL/min (ref >90)

## 2015-02-02 LAB — SURGICAL PCR SCREEN
MRSA, PCR: NEGATIVE
Staphylococcus aureus: NEGATIVE

## 2015-02-02 SURGERY — APPENDECTOMY, LAPAROSCOPIC
Anesthesia: General | Site: Abdomen

## 2015-02-02 MED ORDER — FENTANYL CITRATE 0.05 MG/ML IJ SOLN: 25.0000 ug | INTRAMUSCULAR | Status: DC | PRN

## 2015-02-02 MED ORDER — PROPOFOL 10 MG/ML IV BOLUS
INTRAVENOUS | Status: AC
  Filled 2015-02-02: qty 20

## 2015-02-02 MED ORDER — LIDOCAINE HCL (PF) 2 % IJ SOLN
INTRAMUSCULAR | Status: DC | PRN
  Administered 2015-02-02: 20 mg via INTRADERMAL

## 2015-02-02 MED ORDER — FENTANYL CITRATE 0.05 MG/ML IJ SOLN
INTRAMUSCULAR | Status: AC
  Filled 2015-02-02: qty 5

## 2015-02-02 MED ORDER — KETOROLAC TROMETHAMINE 30 MG/ML IJ SOLN
30.0000 mg | Freq: Once | INTRAMUSCULAR | Status: AC | PRN
  Administered 2015-02-02: 30 mg via INTRAVENOUS

## 2015-02-02 MED ORDER — LIDOCAINE HCL (CARDIAC) 20 MG/ML IV SOLN
INTRAVENOUS | Status: AC
  Filled 2015-02-02: qty 5

## 2015-02-02 MED ORDER — MIDAZOLAM HCL 2 MG/2ML IJ SOLN: 0.5000 mg | Freq: Once | INTRAMUSCULAR | Status: DC | PRN

## 2015-02-02 MED ORDER — PANTOPRAZOLE SODIUM 40 MG IV SOLR
40.0000 mg | Freq: Every day | INTRAVENOUS | Status: DC
  Administered 2015-02-02 (×2): 40 mg via INTRAVENOUS
  Filled 2015-02-02 (×3): qty 40

## 2015-02-02 MED ORDER — MIDAZOLAM HCL 2 MG/2ML IJ SOLN
INTRAMUSCULAR | Status: AC
  Filled 2015-02-02: qty 2

## 2015-02-02 MED ORDER — ONDANSETRON HCL 4 MG/2ML IJ SOLN
INTRAMUSCULAR | Status: DC | PRN
  Administered 2015-02-02: 4 mg via INTRAVENOUS

## 2015-02-02 MED ORDER — NEOSTIGMINE METHYLSULFATE 10 MG/10ML IV SOLN
INTRAVENOUS | Status: DC | PRN
  Administered 2015-02-02: 4 mg via INTRAVENOUS

## 2015-02-02 MED ORDER — ACETAMINOPHEN 160 MG/5ML PO SOLN: 325.0000 mg | ORAL | Status: DC | PRN

## 2015-02-02 MED ORDER — GLYCOPYRROLATE 0.2 MG/ML IJ SOLN
INTRAMUSCULAR | Status: DC | PRN
  Administered 2015-02-02: 0.6 mg via INTRAVENOUS

## 2015-02-02 MED ORDER — PROPOFOL 10 MG/ML IV BOLUS
INTRAVENOUS | Status: DC | PRN
  Administered 2015-02-02: 130 mg via INTRAVENOUS

## 2015-02-02 MED ORDER — HYDROMORPHONE HCL 1 MG/ML IJ SOLN
INTRAMUSCULAR | Status: AC
  Filled 2015-02-02: qty 1

## 2015-02-02 MED ORDER — KCL IN DEXTROSE-NACL 20-5-0.45 MEQ/L-%-% IV SOLN
INTRAVENOUS | Status: DC
  Administered 2015-02-02 – 2015-02-03 (×3): via INTRAVENOUS
  Filled 2015-02-02 (×5): qty 1000

## 2015-02-02 MED ORDER — HEPARIN SODIUM (PORCINE) 5000 UNIT/ML IJ SOLN
5000.0000 [IU] | Freq: Three times a day (TID) | INTRAMUSCULAR | Status: DC
  Administered 2015-02-02 – 2015-02-03 (×4): 5000 [IU] via SUBCUTANEOUS
  Filled 2015-02-02 (×8): qty 1

## 2015-02-02 MED ORDER — ONDANSETRON HCL 4 MG/2ML IJ SOLN
INTRAMUSCULAR | Status: AC
  Filled 2015-02-02: qty 2

## 2015-02-02 MED ORDER — 0.9 % SODIUM CHLORIDE (POUR BTL) OPTIME
TOPICAL | Status: DC | PRN
  Administered 2015-02-02: 1000 mL

## 2015-02-02 MED ORDER — LACTATED RINGERS IV SOLN: INTRAVENOUS | Status: DC

## 2015-02-02 MED ORDER — MENTHOL 3 MG MT LOZG
1.0000 | LOZENGE | OROMUCOSAL | Status: DC | PRN
  Filled 2015-02-02: qty 9

## 2015-02-02 MED ORDER — SUCCINYLCHOLINE CHLORIDE 20 MG/ML IJ SOLN
INTRAMUSCULAR | Status: DC | PRN
  Administered 2015-02-02: 100 mg via INTRAVENOUS

## 2015-02-02 MED ORDER — MORPHINE SULFATE 2 MG/ML IJ SOLN
1.0000 mg | INTRAMUSCULAR | Status: DC | PRN
  Administered 2015-02-02 (×4): 1 mg via INTRAVENOUS
  Filled 2015-02-02 (×4): qty 1

## 2015-02-02 MED ORDER — MIDAZOLAM HCL 5 MG/5ML IJ SOLN
INTRAMUSCULAR | Status: DC | PRN
  Administered 2015-02-02 (×2): 1 mg via INTRAVENOUS

## 2015-02-02 MED ORDER — DEXAMETHASONE SODIUM PHOSPHATE 10 MG/ML IJ SOLN
INTRAMUSCULAR | Status: AC
  Filled 2015-02-02: qty 1

## 2015-02-02 MED ORDER — ONDANSETRON HCL 4 MG/2ML IJ SOLN: 4.0000 mg | Freq: Four times a day (QID) | INTRAMUSCULAR | Status: DC | PRN

## 2015-02-02 MED ORDER — LACTATED RINGERS IV SOLN
INTRAVENOUS | Status: DC | PRN
  Administered 2015-02-02: 06:00:00 via INTRAVENOUS

## 2015-02-02 MED ORDER — KCL IN DEXTROSE-NACL 20-5-0.45 MEQ/L-%-% IV SOLN
INTRAVENOUS | Status: AC
  Administered 2015-02-02: 1000 mL
  Filled 2015-02-02: qty 1000

## 2015-02-02 MED ORDER — LACTATED RINGERS IV SOLN
INTRAVENOUS | Status: DC | PRN
  Administered 2015-02-02: 1000 mL

## 2015-02-02 MED ORDER — MEPERIDINE HCL 50 MG/ML IJ SOLN: 6.2500 mg | INTRAMUSCULAR | Status: DC | PRN

## 2015-02-02 MED ORDER — ROCURONIUM BROMIDE 100 MG/10ML IV SOLN
INTRAVENOUS | Status: AC
Start: 2015-02-02 — End: 2015-02-02
  Filled 2015-02-02: qty 1

## 2015-02-02 MED ORDER — FENTANYL CITRATE 0.05 MG/ML IJ SOLN
INTRAMUSCULAR | Status: DC | PRN
  Administered 2015-02-02: 100 ug via INTRAVENOUS
  Administered 2015-02-02: 50 ug via INTRAVENOUS

## 2015-02-02 MED ORDER — ROCURONIUM BROMIDE 100 MG/10ML IV SOLN
INTRAVENOUS | Status: DC | PRN
  Administered 2015-02-02: 20 mg via INTRAVENOUS

## 2015-02-02 MED ORDER — PROMETHAZINE HCL 25 MG/ML IJ SOLN: 6.2500 mg | INTRAMUSCULAR | Status: DC | PRN

## 2015-02-02 MED ORDER — KETOROLAC TROMETHAMINE 30 MG/ML IJ SOLN
INTRAMUSCULAR | Status: AC
  Filled 2015-02-02: qty 1

## 2015-02-02 MED ORDER — PIPERACILLIN-TAZOBACTAM 3.375 G IVPB
3.3750 g | Freq: Three times a day (TID) | INTRAVENOUS | Status: DC
  Administered 2015-02-02 – 2015-02-03 (×4): 3.375 g via INTRAVENOUS
  Filled 2015-02-02 (×5): qty 50

## 2015-02-02 MED ORDER — BUPIVACAINE LIPOSOME 1.3 % IJ SUSP
20.0000 mL | Freq: Once | INTRAMUSCULAR | Status: AC
  Administered 2015-02-02: 20 mL
  Filled 2015-02-02: qty 20

## 2015-02-02 MED ORDER — DEXAMETHASONE SODIUM PHOSPHATE 10 MG/ML IJ SOLN
INTRAMUSCULAR | Status: DC | PRN
  Administered 2015-02-02: 10 mg via INTRAVENOUS

## 2015-02-02 MED ORDER — ACETAMINOPHEN 325 MG PO TABS: 325.0000 mg | ORAL_TABLET | ORAL | Status: DC | PRN

## 2015-02-02 MED ORDER — ACETAMINOPHEN 325 MG PO TABS: 650.0000 mg | ORAL_TABLET | ORAL | Status: DC | PRN

## 2015-02-02 MED ORDER — HYDROCODONE-ACETAMINOPHEN 5-325 MG PO TABS
1.0000 | ORAL_TABLET | ORAL | Status: DC | PRN
  Administered 2015-02-02 – 2015-02-03 (×5): 2 via ORAL
  Filled 2015-02-02 (×5): qty 2

## 2015-02-02 SURGICAL SUPPLY — 30 items
APPLIER CLIP ROT 10 11.4 M/L (STAPLE)
CABLE HIGH FREQUENCY MONO STRZ (ELECTRODE) IMPLANT
CLIP APPLIE ROT 10 11.4 M/L (STAPLE) IMPLANT
CUTTER FLEX LINEAR 45M (STAPLE) ×3 IMPLANT
DECANTER SPIKE VIAL GLASS SM (MISCELLANEOUS) IMPLANT
DRAPE LAPAROSCOPIC ABDOMINAL (DRAPES) ×3 IMPLANT
ELECT REM PT RETURN 9FT ADLT (ELECTROSURGICAL) ×3
ELECTRODE REM PT RTRN 9FT ADLT (ELECTROSURGICAL) ×1 IMPLANT
ENDOLOOP SUT PDS II  0 18 (SUTURE)
ENDOLOOP SUT PDS II 0 18 (SUTURE) IMPLANT
GLOVE BIOGEL M 8.0 STRL (GLOVE) ×12 IMPLANT
GOWN STRL REUS W/TWL XL LVL3 (GOWN DISPOSABLE) ×9 IMPLANT
KIT BASIN OR (CUSTOM PROCEDURE TRAY) ×3 IMPLANT
POUCH RETRIEVAL ECOSAC 10 (ENDOMECHANICALS) IMPLANT
POUCH RETRIEVAL ECOSAC 10MM (ENDOMECHANICALS)
POUCH SPECIMEN RETRIEVAL 10MM (ENDOMECHANICALS) ×3 IMPLANT
RELOAD 45 VASCULAR/THIN (ENDOMECHANICALS) ×3 IMPLANT
RELOAD STAPLE TA45 3.5 REG BLU (ENDOMECHANICALS) IMPLANT
SCISSORS LAP 5X45 EPIX DISP (ENDOMECHANICALS) IMPLANT
SET IRRIG TUBING LAPAROSCOPIC (IRRIGATION / IRRIGATOR) ×3 IMPLANT
SHEARS CURVED HARMONIC AC 45CM (MISCELLANEOUS) ×3 IMPLANT
SLEEVE XCEL OPT CAN 5 100 (ENDOMECHANICALS) IMPLANT
STAPLER VISISTAT 35W (STAPLE) ×3 IMPLANT
SUT VIC AB 4-0 SH 18 (SUTURE) ×3 IMPLANT
TOWEL OR 17X26 10 PK STRL BLUE (TOWEL DISPOSABLE) ×3 IMPLANT
TRAY FOLEY CATH 14FRSI W/METER (CATHETERS) ×3 IMPLANT
TRAY LAPAROSCOPIC (CUSTOM PROCEDURE TRAY) ×3 IMPLANT
TROCAR BLADELESS OPT 5 100 (ENDOMECHANICALS) ×3 IMPLANT
TROCAR XCEL BLUNT TIP 100MML (ENDOMECHANICALS) ×3 IMPLANT
TROCAR XCEL NON-BLD 11X100MML (ENDOMECHANICALS) IMPLANT

## 2015-02-02 NOTE — H&P (Signed)
Chief Complaint:  Right lower quadrant pain-appendicitis by CT  History of Present Illness:  Paula Beard is an 39 y.o. female presented to [facility] today with a 2 day history of bloating that evolved into right lower quadrant pain today.  She was sent by ambulance to [facility] by ambulance where a CT scan was performed which showed acute appendicitis.  She was referred to [facility] and 2.5 hrs later she arrived at [facility] where she was seen by me in the ED.  She has never had any abdominal surgery.  She has a history of H Pylori infection about 2 years ago.  I explained lap and open appendectomy in some detail.    Past Medical History  Diagnosis Date  . Asthma   . GERD (gastroesophageal reflux disease)     History reviewed. No pertinent past surgical history.  No current facility-administered medications for this encounter.   Current Outpatient Prescriptions  Medication Sig Dispense Refill  . Dexlansoprazole 30 MG capsule Take 30 mg by mouth daily.    . Albuterol (VENTOLIN IN) Inhale into the lungs.      . famotidine (PEPCID) 20 MG tablet Take 20 mg by mouth 2 (two) times daily.    Marland Kitchen triamcinolone (NASACORT AQ) 55 MCG/ACT nasal inhaler Place 2 sprays into the nose daily.       Review of patient's allergies indicates no known allergies. History reviewed. No pertinent family history. Social History:   reports that she has never smoked. She does not have any smokeless tobacco history on file. She reports that she does not drink alcohol or use illicit drugs.   REVIEW OF SYSTEMS : Negative except for H pylori.  Vaginal births of her children  Physical Exam:   Blood pressure 155/94, pulse 74, temperature 98.8 F (37.1 C), temperature source Oral, resp. rate 20, height 5' 8"  (1.727 m), weight 85.276 kg (188 lb), SpO2 98 %. Body mass index is 28.59 kg/(m^2).  Gen:  WDWN AAF NAD  Neurological: Alert and oriented to person, place, and time. Motor and sensory function is grossly  intact  Head: Normocephalic and atraumatic.  Eyes: Conjunctivae are normal. Pupils are equal, round, and reactive to light. No scleral icterus.  Neck: Normal range of motion. Neck supple. No tracheal deviation or thyromegaly present.  Cardiovascular:  SR without murmurs or gallops.  No carotid bruits Breast:  Not examined Respiratory: Effort normal.  No respiratory distress. No chest wall tenderness. Breath sounds normal.  No wheezes, rales or rhonchi.  Abdomen:  Tender in the right lower quadrant GU:  Not examined Musculoskeletal: Normal range of motion. Extremities are nontender. No cyanosis, edema or clubbing noted Lymphadenopathy: No cervical, preauricular, postauricular or axillary adenopathy is present Skin: Skin is warm and dry. No rash noted. No diaphoresis. No erythema. No pallor. Pscyh: Normal mood and affect. Behavior is normal. Judgment and thought content normal.   LABORATORY RESULTS: Results for orders placed or performed during the hospital encounter of 02/01/15 (from the past 48 hour(s))  Urinalysis, Routine w reflex microscopic     Status: Abnormal   Collection Time: 02/01/15  6:30 PM  Result Value Ref Range   Color, Urine YELLOW YELLOW   APPearance CLEAR CLEAR   Specific Gravity, Urine 1.007 1.005 - 1.030   pH 6.5 5.0 - 8.0   Glucose, UA NEGATIVE NEGATIVE mg/dL   Hgb urine dipstick SMALL (A) NEGATIVE   Bilirubin Urine NEGATIVE NEGATIVE   Ketones, ur NEGATIVE NEGATIVE mg/dL  Protein, ur NEGATIVE NEGATIVE mg/dL   Urobilinogen, UA 0.2 0.0 - 1.0 mg/dL   Nitrite NEGATIVE NEGATIVE   Leukocytes, UA NEGATIVE NEGATIVE  Pregnancy, urine     Status: None   Collection Time: 02/01/15  6:30 PM  Result Value Ref Range   Preg Test, Ur NEGATIVE NEGATIVE    Comment:        THE SENSITIVITY OF THIS METHODOLOGY IS >20 mIU/mL.   Urine microscopic-add on     Status: None   Collection Time: 02/01/15  6:30 PM  Result Value Ref Range   Squamous Epithelial / LPF RARE RARE   WBC, UA  0-2 <3 WBC/hpf   RBC / HPF 3-6 <3 RBC/hpf   Bacteria, UA RARE RARE   Urine-Other MUCOUS PRESENT   CBC     Status: Abnormal   Collection Time: 02/01/15  6:45 PM  Result Value Ref Range   WBC 11.1 (H) 4.0 - 10.5 K/uL   RBC 4.46 3.87 - 5.11 MIL/uL   Hemoglobin 13.7 12.0 - 15.0 g/dL   HCT 39.8 36.0 - 46.0 %   MCV 89.2 78.0 - 100.0 fL   MCH 30.7 26.0 - 34.0 pg   MCHC 34.4 30.0 - 36.0 g/dL   RDW 12.2 11.5 - 15.5 %   Platelets 305 150 - 400 K/uL  Comprehensive metabolic panel     Status: Abnormal   Collection Time: 02/01/15  6:45 PM  Result Value Ref Range   Sodium 136 135 - 145 mmol/L   Potassium 3.6 3.5 - 5.1 mmol/L   Chloride 108 96 - 112 mmol/L   CO2 23 19 - 32 mmol/L   Glucose, Bld 111 (H) 70 - 99 mg/dL   BUN 13 6 - 23 mg/dL   Creatinine, Ser 0.76 0.50 - 1.10 mg/dL   Calcium 8.6 8.4 - 10.5 mg/dL   Total Protein 7.6 6.0 - 8.3 g/dL   Albumin 4.0 3.5 - 5.2 g/dL   AST 26 0 - 37 U/L   ALT 18 0 - 35 U/L   Alkaline Phosphatase 90 39 - 117 U/L   Total Bilirubin 0.1 (L) 0.3 - 1.2 mg/dL   GFR calc non Af Amer >90 >90 mL/min   GFR calc Af Amer >90 >90 mL/min    Comment: (NOTE) The eGFR has been calculated using the CKD EPI equation. This calculation has not been validated in all clinical situations. eGFR's persistently <90 mL/min signify possible Chronic Kidney Disease.    Anion gap 5 5 - 15  Lipase, blood     Status: None   Collection Time: 02/01/15  6:45 PM  Result Value Ref Range   Lipase 34 11 - 59 U/L     RADIOLOGY RESULTS: Ct Abdomen Pelvis W Contrast  02/01/2015   CLINICAL DATA:  Right-sided abdominal pain for 3 days, worsening today.  EXAM: CT ABDOMEN AND PELVIS WITH CONTRAST  TECHNIQUE: Multidetector CT imaging of the abdomen and pelvis was performed using the standard protocol following bolus administration of intravenous contrast.  CONTRAST:  42m OMNIPAQUE IOHEXOL 300 MG/ML SOLN, 1012mOMNIPAQUE IOHEXOL 300 MG/ML SOLN  COMPARISON:  None.  FINDINGS: There is  inflammation of the appendiceal tip. The base of the appendix is normal in appearance. The appendiceal tip is mildly enlarged, measuring a little over 8 mm. There is no abscess. There is no extraluminal air. Remainder of the bowel is normal in appearance. No other acute inflammatory changes are evident in the abdomen or pelvis.  There are unremarkable  appearances of the liver, spleen, pancreas, adrenals and kidneys. The uterus and ovaries appear unremarkable. No significant abnormalities are evident in the lower chest.  No acute musculoskeletal abnormalities are evident. There is a small fat containing umbilical hernia.  IMPRESSION: Acute appendicitis. These results were called by telephone at the time of interpretation on 02/01/2015 at 8:49 pm to Dr. Alfonzo Beers , who verbally acknowledged these results.   Electronically Signed   By: Andreas Newport M.D.   On: 02/01/2015 20:49    Problem List: Patient Active Problem List   Diagnosis Date Noted  . 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 UTI 11/18/2007  . ASTHMA, UNSPECIFIED 12/30/2006    Assessment & Plan: Acute appendicitis.  Received Zosyn at [facility].  Will plan operative intervention-lap appy.      Matt B. Hassell Done, MD, [facility] [phone] beeper [phone]  02/02/2015 12:28 AM

## 2015-02-02 NOTE — Progress Notes (Signed)
Utilization Review completed.  

## 2015-02-02 NOTE — Op Note (Signed)
Surgeon: Kaylyn Lim, MD, FACS  Asst:  none  Anes:  general  Procedure: Laparoscopic appendectomy  Diagnosis: Acute appendicitis  Complications: none  EBL:   4 cc  Drains: none  Description of Procedure:  The patient was taken to OR 1 at [facility] on Saturday morning, April 2.  After anesthesia was administered and the patient was prepped a timeout was performed.  Access was achieved with Hasson technique through the umbilicus.  An 11 mm trocar was inserted through the left lower quadrant and a 5 mm was inserted through the right upper quadrant.  The appendix came off the taenia and was free lying.  The mesentery was divided with a harmonic scalpel.  The base of the appendix was isolated and transected wth a 4.5 mm vascular load.  No bleeding was noted.  Exparel was injected into the trocar sites.  The umbilical site which was within a small umbilical hernia was closed with a fig of 8 of 0 vicryl.  This closure was examined with the scope.  The gas was withdrawn and the site incisions were closed with 4.-0 vicryl and Liquiban.   The patient tolerated the procedure well and was taken to the PACU in stable condition.     Matt B. Hassell Done, [place], [facility], [place]

## 2015-02-02 NOTE — Anesthesia Postprocedure Evaluation (Signed)
  Anesthesia Post-op Note  Patient: Paula Beard  Procedure(s) Performed: Procedure(s): APPENDECTOMY LAPAROSCOPIC (N/A)  Patient Location: PACU  Anesthesia Type:General  Level of Consciousness: awake, alert , oriented and patient cooperative  Airway and Oxygen Therapy: Patient Spontanous Breathing and Patient connected to nasal cannula oxygen  Post-op Pain: none  Post-op Assessment: Post-op Vital signs reviewed, Patient's Cardiovascular Status Stable, Respiratory Function Stable, Patent Airway, No signs of Nausea or vomiting and Pain level controlled  Post-op Vital Signs: Reviewed and stable  Last Vitals:  Filed Vitals:   02/02/15 0755  BP:   Pulse: 54  Temp:   Resp: 18    Complications: No apparent anesthesia complications

## 2015-02-02 NOTE — Transfer of Care (Signed)
Immediate Anesthesia Transfer of Care Note  Patient: Paula Beard  Procedure(s) Performed: Procedure(s) (LRB): APPENDECTOMY LAPAROSCOPIC (N/A)  Patient Location: PACU  Anesthesia Type: General  Level of Consciousness: sedated, patient cooperative and responds to stimulation  Airway & Oxygen Therapy: Patient Spontanous Breathing and Patient connected to face mask oxgen  Post-op Assessment: Report given to PACU RN and Post -op Vital signs reviewed and stable  Post vital signs: Reviewed and stable  Complications: No apparent anesthesia complications

## 2015-02-02 NOTE — Anesthesia Procedure Notes (Signed)
Procedure Name: Intubation Date/Time: 02/02/2015 6:22 AM Performed by: Lajuana Carry E Pre-anesthesia Checklist: Patient identified, Emergency Drugs available, Suction available and Patient being monitored Patient Re-evaluated:Patient Re-evaluated prior to inductionOxygen Delivery Method: Circle System Utilized Preoxygenation: Pre-oxygenation with 100% oxygen Intubation Type: IV induction, Rapid sequence and Cricoid Pressure applied Ventilation: Mask ventilation without difficulty Laryngoscope Size: Miller, 2, Glidescope and 3 (Attempt w/miller 2, no view) Grade View: Grade I Tube type: Oral Tube size: 7.0 mm Number of attempts: 2 Airway Equipment and Method: Stylet and Video-laryngoscopy Placement Confirmation: ETT inserted through vocal cords under direct vision,  positive ETCO2 and breath sounds checked- equal and bilateral Secured at: 21 cm Tube secured with: Tape Dental Injury: Teeth and Oropharynx as per pre-operative assessment  Difficulty Due To: Difficult Airway- due to limited oral opening Comments: RSI, quick look w/ miller 2 w/ no view, limited mouth opening. Change to Glidescope w/ grade 1 view. Slight challenge w/ passing ett thru cords d/t limited mouth opening. No trauma to teeth/lips/gums, no desat noted.

## 2015-02-02 NOTE — Anesthesia Preprocedure Evaluation (Addendum)
Anesthesia Evaluation  Patient identified by MRN, date of birth, ID band Patient awake    Reviewed: Allergy & Precautions, H&P , Patient's Chart, lab work & pertinent test results, reviewed documented beta blocker date and time   History of Anesthesia Complications Negative for: history of anesthetic complications  Airway Mallampati: III  TM Distance: >3 FB Neck ROM: full    Dental   Pulmonary asthma ,  breath sounds clear to auscultation        Cardiovascular Exercise Tolerance: Good Rhythm:regular Rate:Normal     Neuro/Psych    GI/Hepatic GERD-  ,  Endo/Other    Renal/GU      Musculoskeletal   Abdominal   Peds  Hematology   Anesthesia Other Findings   Reproductive/Obstetrics                            Anesthesia Physical Anesthesia Plan  ASA: II and emergent  Anesthesia Plan: General ETT   Post-op Pain Management:    Induction: Rapid sequence, Cricoid pressure planned and Intravenous  Airway Management Planned:   Additional Equipment:   Intra-op Plan:   Post-operative Plan:   Informed Consent: I have reviewed the patients History and Physical, chart, labs and discussed the procedure including the risks, benefits and alternatives for the proposed anesthesia with the patient or authorized representative who has indicated his/her understanding and acceptance.   Dental Advisory Given  Plan Discussed with: CRNA and Surgeon  Anesthesia Plan Comments:        Anesthesia Quick Evaluation

## 2015-02-03 LAB — CBC
HCT: 34.9 % — ABNORMAL LOW (ref 36.0–46.0)
Hemoglobin: 11.6 g/dL — ABNORMAL LOW (ref 12.0–15.0)
MCH: 30.4 pg (ref 26.0–34.0)
MCHC: 33.2 g/dL (ref 30.0–36.0)
MCV: 91.4 fL (ref 78.0–100.0)
PLATELETS: 280 10*3/uL (ref 150–400)
RBC: 3.82 MIL/uL — ABNORMAL LOW (ref 3.87–5.11)
RDW: 12.9 % (ref 11.5–15.5)
WBC: 9.8 10*3/uL (ref 4.0–10.5)

## 2015-02-03 MED ORDER — HYDROCODONE-ACETAMINOPHEN 5-325 MG PO TABS: 1.0000 | ORAL_TABLET | ORAL | Status: AC | PRN

## 2015-02-03 NOTE — Discharge Summary (Signed)
Physician Discharge Summary  Patient ID: Paula Beard MRN: [id number] DOB/AGE: [birth year] 39 y.o.  Admit date: 02/01/2015 Discharge date: 02/03/2015  Admission Diagnoses:  appendicitis  Discharge Diagnoses:  same  Active Problems:   S/P laparoscopic appendectomy April 2016   Surgery:  Laparoscopic appendectomy  Discharged Condition: improved  Hospital Course:   Had surgery early Saturday morning.  Observed overnight.  Ready for discharge on PD 1  Consults: none  Significant Diagnostic Studies: CT scan-appendicitis    Discharge Exam: Blood pressure 111/69, pulse 72, temperature 99.3 F (37.4 C), temperature source Oral, resp. rate 18, height 5\' 8"  (1.727 m), weight 84.823 kg (187 lb), last menstrual period 01/22/2015, SpO2 100 %. Incisions ok  Disposition: 01-Home or Self Care  Discharge Instructions    Call MD for:  redness, tenderness, or signs of infection (pain, swelling, redness, odor or green/yellow discharge around incision site)    Complete by:  As directed      Diet - low sodium heart healthy    Complete by:  As directed      Discharge instructions    Complete by:  As directed   May shower ad lib Advance diet as tolerated.   Avoid constipation with laxative of choice     Increase activity slowly    Complete by:  As directed      No wound care    Complete by:  As directed             Medication List    TAKE these medications        Dexlansoprazole 30 MG capsule  Take 30 mg by mouth daily.     famotidine 20 MG tablet  Commonly known as:  PEPCID  Take 20 mg by mouth 2 (two) times daily.     HYDROcodone-acetaminophen 5-325 MG per tablet  Commonly known as:  NORCO/VICODIN  Take 1-2 tablets by mouth every 4 (four) hours as needed for moderate pain.     NASACORT AQ 55 MCG/ACT nasal inhaler  Generic drug:  triamcinolone  Place 2 sprays into the nose daily.     VENTOLIN IN  Inhale into the lungs.           Follow-up Information    Follow up  with Johnathan Hausen B, MD. Schedule an appointment as soon as possible for a visit in 3 weeks.   Specialty:  General Surgery   Contact information:   [place] [address] [phone]       Signed: Pedro Earls 02/03/2015, 9:25 AM

## 2015-02-03 NOTE — Discharge Instructions (Signed)
May return to work when you feel strong enough. May drive vehicle when not taking pain meds.

## 2015-02-03 NOTE — Progress Notes (Signed)
Assessment unchanged. Pt verbalized understanding of dc instructions through teach back. Script x 1 given as provided by MD. Discharged via wc to front entrance to meet awaiting vehicle to carry home. Accompanied by family and NT.

## 2015-02-04 ENCOUNTER — Encounter (HOSPITAL_COMMUNITY): Payer: Self-pay | Admitting: Surgery

## 2016-02-16 IMAGING — CT CT ABD-PELV W/ CM
2 of 5 series · 17 of 46 positions shown, 19 images · IV contrast (omnipaque)
Comparison: None.

CLINICAL DATA: Right-sided abdominal pain for 3 days, worsening
today.

EXAM:
CT ABDOMEN AND PELVIS WITH CONTRAST
TECHNIQUE: Multidetector CT imaging of the abdomen and pelvis was performed
using the standard protocol following bolus administration of
intravenous contrast.
CONTRAST:  25mL OMNIPAQUE IOHEXOL 300 MG/ML SOLN, 100mL OMNIPAQUE
IOHEXOL 300 MG/ML SOLN

[Series 2: abd/pelvis 5.0 b31f · axial · 0.76mm/px · z∈[-470,-44]mm · 14 of 95 slices shown, 16 images]
[im 5/95  soft-tissue]
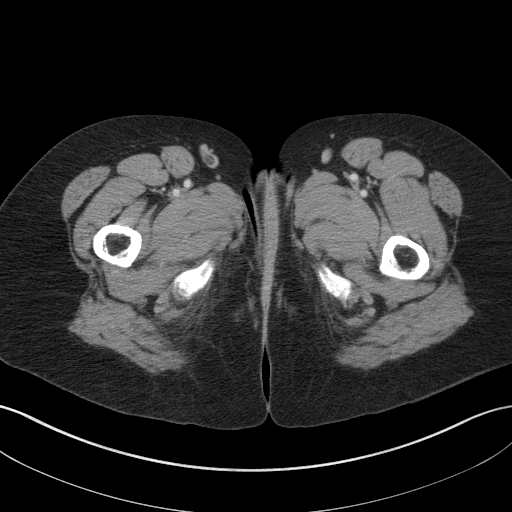
[im 5/95  bone]
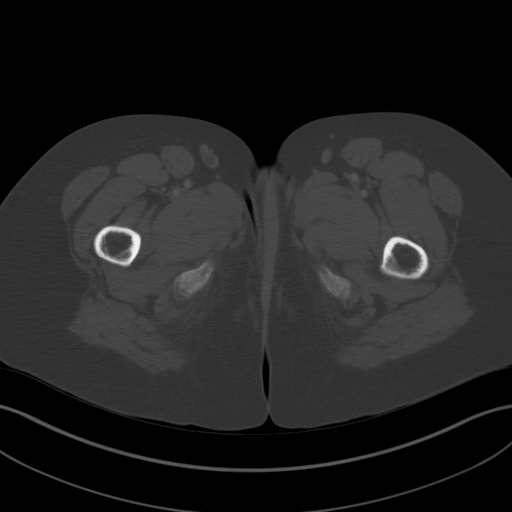
[im 10/95  soft-tissue]
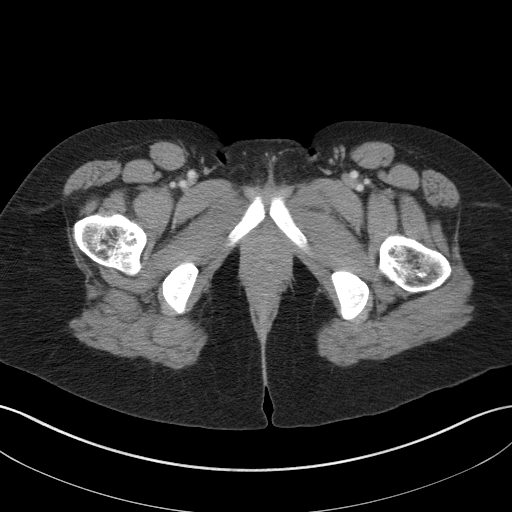
[im 20/95  soft-tissue]
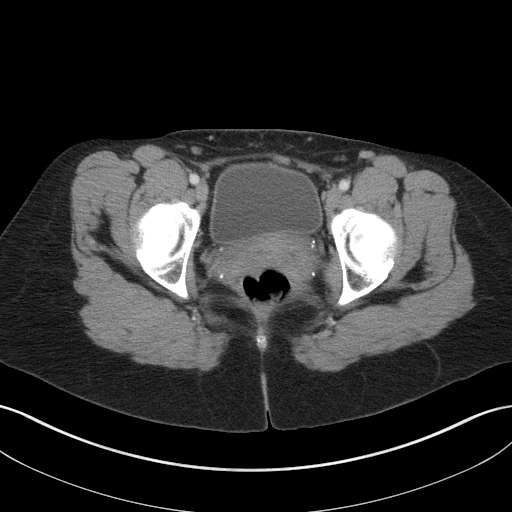
[im 25/95  soft-tissue]
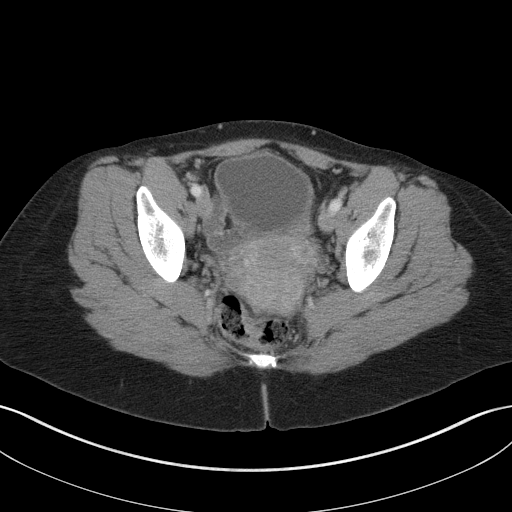
[im 30/95  soft-tissue]
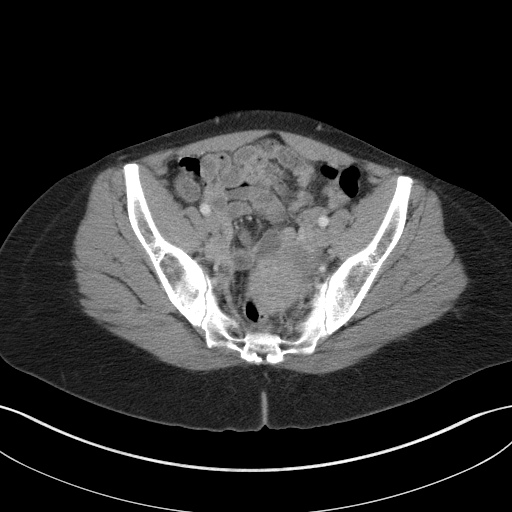
[im 40/95  soft-tissue]
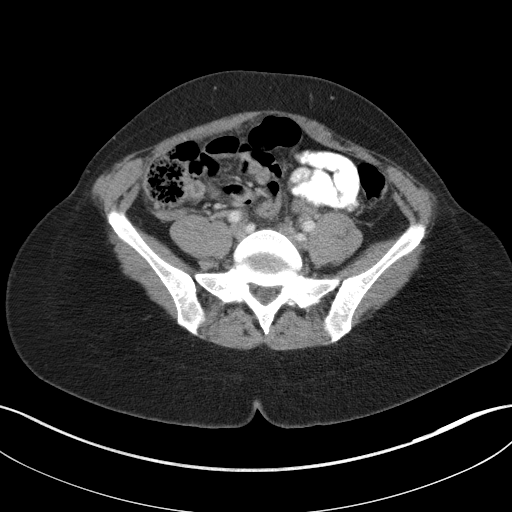
[im 45/95  soft-tissue]
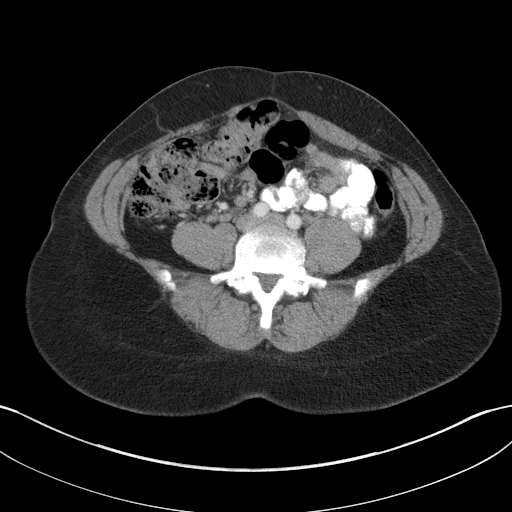
[im 50/95  soft-tissue]
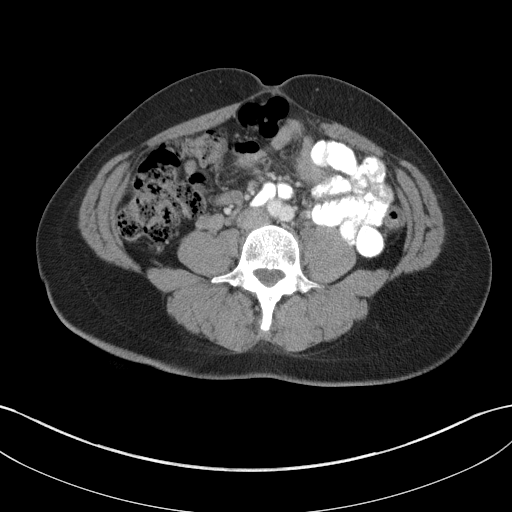
[im 55/95  soft-tissue]
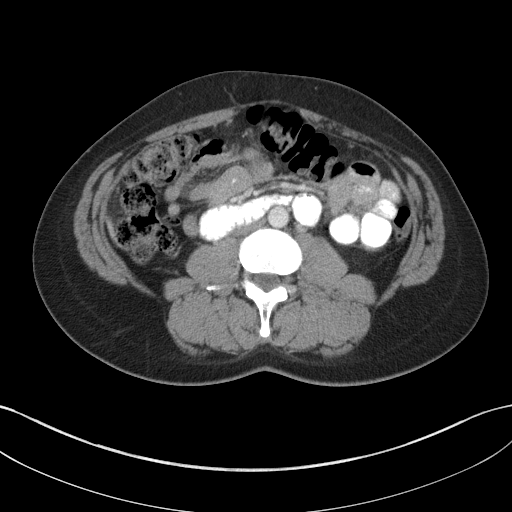
[im 55/95  bone]
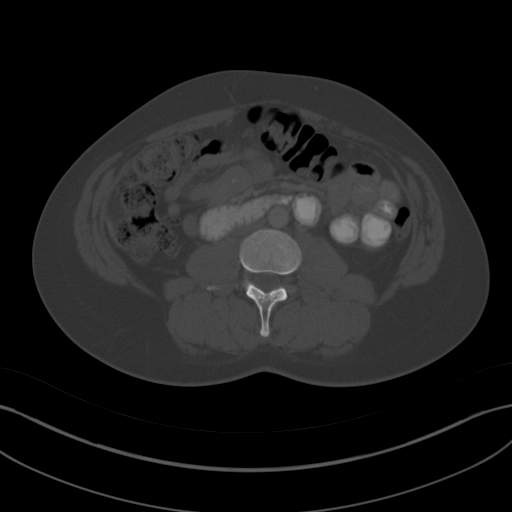
[im 65/95  soft-tissue]
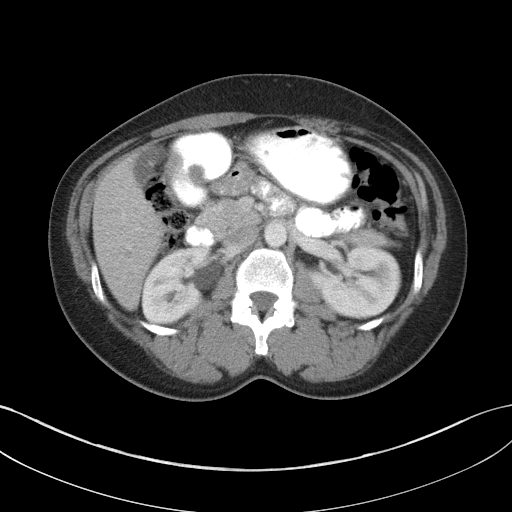
[im 70/95  soft-tissue]
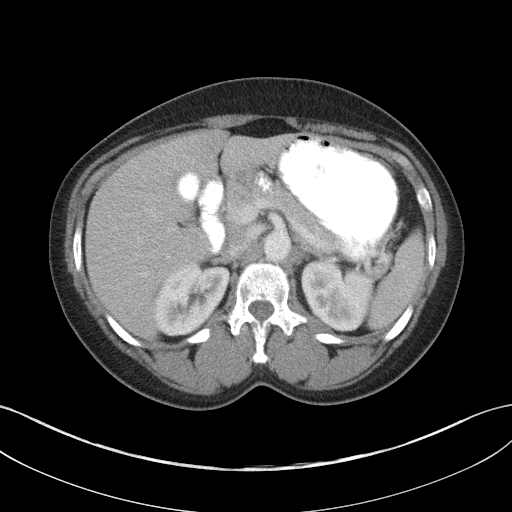
[im 75/95  soft-tissue]
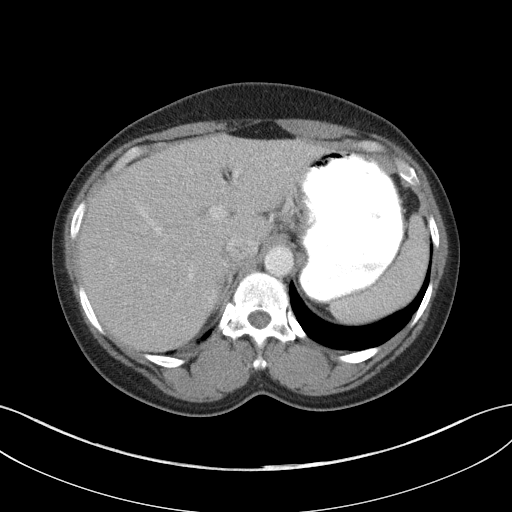
[im 85/95  soft-tissue]
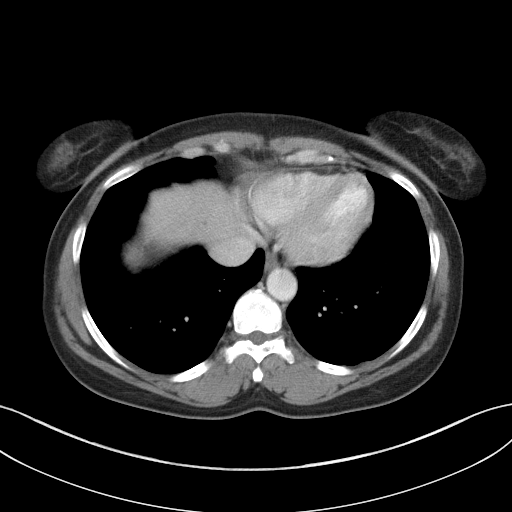
[im 90/95  soft-tissue]
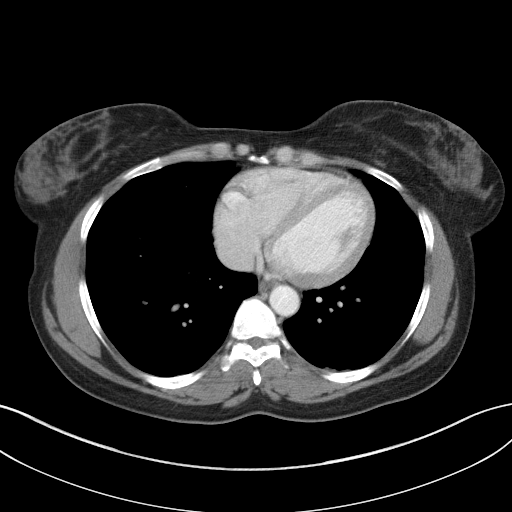

[Series 5: abd/pelvis 3.0 coronal · coronal · 0.88mm/px · 3 of 78 slices shown]
[im 26/78  soft-tissue]
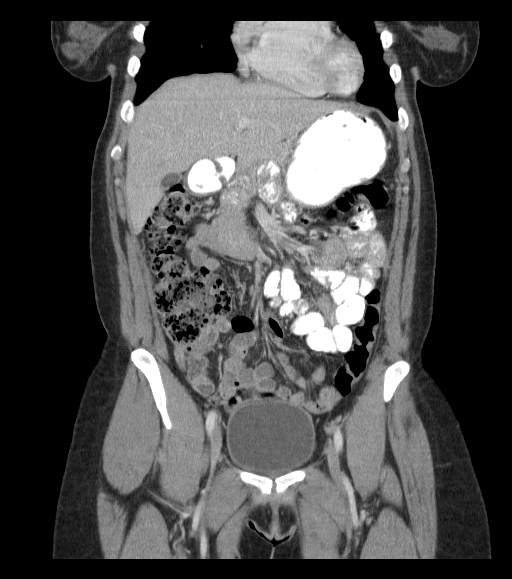
[im 35/78  soft-tissue]
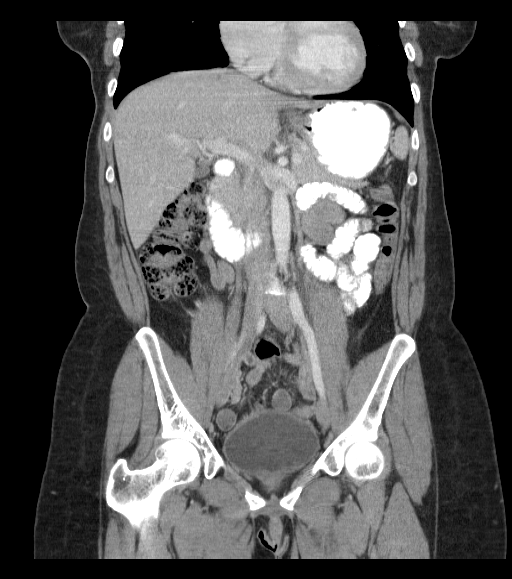
[im 43/78  soft-tissue]
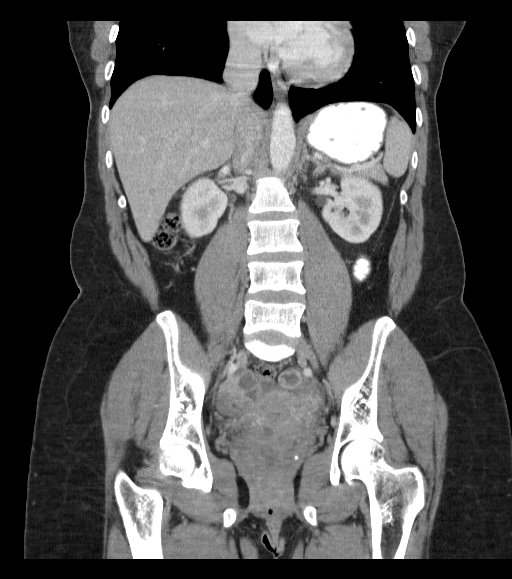

[17 of 46 positions shown; findings below may reference images not displayed]

FINDINGS: There is inflammation of the appendiceal tip. The base of the
appendix is normal in appearance. The appendiceal tip is mildly
enlarged, measuring a little over 8 mm. There is no abscess. There
is no extraluminal air. Remainder of the bowel is normal in
appearance. No other acute inflammatory changes are evident in the
abdomen or pelvis.

There are unremarkable appearances of the liver, spleen, pancreas,
adrenals and kidneys. The uterus and ovaries appear unremarkable. No
significant abnormalities are evident in the lower chest.

No acute musculoskeletal abnormalities are evident. There is a small
fat containing umbilical hernia.
IMPRESSION: Acute appendicitis. These results were called by telephone at the
time of interpretation on 02/01/2015 at [DATE] to Dr. NAREL NOGUERA ,
who verbally acknowledged these results.
